# Patient Record
Sex: Female | Born: 2000 | Race: Black or African American | Hispanic: No | Marital: Single | Smoking: Never smoker
Health system: Southern US, Community
[De-identification: ages and names within clinical notes are randomized; demographics above are authoritative.]

## PROBLEM LIST (undated history)

## (undated) DIAGNOSIS — J45909 Unspecified asthma, uncomplicated: Secondary | ICD-10-CM

## (undated) DIAGNOSIS — E669 Obesity, unspecified: Secondary | ICD-10-CM

## (undated) DIAGNOSIS — IMO0002 Reserved for concepts with insufficient information to code with codable children: Secondary | ICD-10-CM

## (undated) DIAGNOSIS — E663 Overweight: Secondary | ICD-10-CM

## (undated) DIAGNOSIS — F419 Anxiety disorder, unspecified: Secondary | ICD-10-CM

## (undated) DIAGNOSIS — Z559 Problems related to education and literacy, unspecified: Secondary | ICD-10-CM

## (undated) DIAGNOSIS — Z0101 Encounter for examination of eyes and vision with abnormal findings: Secondary | ICD-10-CM

## (undated) DIAGNOSIS — J309 Allergic rhinitis, unspecified: Secondary | ICD-10-CM

## (undated) DIAGNOSIS — D573 Sickle-cell trait: Secondary | ICD-10-CM

## (undated) HISTORY — DX: Problems related to education and literacy, unspecified: Z55.9

## (undated) HISTORY — DX: Anxiety disorder, unspecified: F41.9

## (undated) NOTE — *Deleted (*Deleted)
Pt presents via POV with mother c/o swollen right index finger. Reports given abx for infections x4 days ago with worsening symptoms. Denies fevers.

---
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
## 2022-03-25 ENCOUNTER — Emergency Department (HOSPITAL_BASED_OUTPATIENT_CLINIC_OR_DEPARTMENT_OTHER)
Admission: EM | Admit: 2022-03-25 | Discharge: 2022-03-25 | Disposition: A | Discharge status: Home / Self Care | Payer: Medicaid Other | Attending: Emergency Medicine | Admitting: Emergency Medicine

## 2022-03-25 ENCOUNTER — Other Ambulatory Visit: Payer: Self-pay

## 2022-03-25 ENCOUNTER — Encounter (HOSPITAL_BASED_OUTPATIENT_CLINIC_OR_DEPARTMENT_OTHER): Payer: Self-pay

## 2022-03-25 ENCOUNTER — Emergency Department (HOSPITAL_BASED_OUTPATIENT_CLINIC_OR_DEPARTMENT_OTHER): Payer: Medicaid Other | Admitting: Radiology

## 2022-03-25 DIAGNOSIS — S80919A Unspecified superficial injury of unspecified knee, initial encounter: Secondary | ICD-10-CM | POA: Diagnosis not present

## 2022-03-25 DIAGNOSIS — Y92410 Unspecified street and highway as the place of occurrence of the external cause: Secondary | ICD-10-CM | POA: Diagnosis not present

## 2022-03-25 DIAGNOSIS — I10 Essential (primary) hypertension: Secondary | ICD-10-CM | POA: Diagnosis not present

## 2022-03-25 DIAGNOSIS — M25511 Pain in right shoulder: Secondary | ICD-10-CM | POA: Insufficient documentation

## 2022-03-25 DIAGNOSIS — G4489 Other headache syndrome: Secondary | ICD-10-CM | POA: Diagnosis not present

## 2022-03-25 DIAGNOSIS — S4991XA Unspecified injury of right shoulder and upper arm, initial encounter: Secondary | ICD-10-CM | POA: Diagnosis not present

## 2022-03-25 MED ORDER — ACETAMINOPHEN 500 MG PO TABS
1000.0000 mg | ORAL_TABLET | Freq: Once | ORAL | Status: AC
  Administered 2022-03-25: 1000 mg via ORAL
  Filled 2022-03-25: qty 2
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
## 2022-03-25 NOTE — ED Notes (Signed)
EMT-P provided AVS using Teachback Method. Patient verbalizes understanding of Discharge Instructions. Opportunity for Questioning and Answers were provided by EMT-P. Patient Discharged from ED.  ? ?

## 2022-03-25 NOTE — Discharge Instructions (Signed)
The x-ray of your shoulder is without any fracture or dislocation. ?Please take Tylenol 1000 mg every 6 hours as needed for pain drink plenty of water.  Gentle stretching, warm Epsom salt soaks and hydrating is very important. ? ?Your examination today is most concerning for a muscular injury ?1. Medications: alternate ibuprofen and tylenol for pain control, take all usual home medications as they are prescribed ?2. Treatment: rest, ice, elevate and use an ACE wrap or other compressive therapy to decrease swelling. Also drink plenty of fluids and do plenty of gentle stretching and move the affected muscle through its normal range of motion to prevent stiffness. ?3. Follow Up: If your symptoms do not improve please follow up with orthopedics/sports medicine or your PCP for discussion of your diagnoses and further evaluation after today's visit; if you do not have a primary care doctor use the resource guide provided to find one; Please return to the ER for worsening symptoms or other concerns.  ? ?
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
## 2022-03-26 ENCOUNTER — Telehealth: Payer: Self-pay

## 2022-03-26 NOTE — Telephone Encounter (Signed)
Transition Care Management Follow-up Telephone Call ?Date of discharge and from where: 03/25/2022 from [place] ?How have you been since you were released from the hospital? Patient stated that she is still in some pain and still has a headache. ED return precautions reviewed, including sob, cp, dizziness, changes in vision or n/v. Patient and patient mother stated understanding.  ?Any questions or concerns? No ? ?Items Reviewed: ?Did the pt receive and understand the discharge instructions provided? Yes  ?Medications obtained and verified? Yes  ?Other? No  ?Any new allergies since your discharge? No  ?Dietary orders reviewed? No ?Do you have support at home? Yes  ? ?Functional Questionnaire: (I = Independent and D = Dependent) ?ADLs: I ?Bathing/Dressing- I ?Meal Prep- I ?Eating- I ?Maintaining continence- I ?Transferring/Ambulation- I ?Managing Meds- I ? ?Follow up appointments reviewed: ? ?PCP Hospital f/u appt confirmed? Yes  Scheduled to see Eugenia Pancoast, FNP on 04/25/2022 @ 08:20am. ?[facility] f/u appt confirmed? No   ?Are transportation arrangements needed? No  ?If their condition worsens, is the pt aware to call PCP or go to the Emergency Dept.? Yes ?Was the patient provided with contact information for the PCP's office or ED? Yes ?Was to pt encouraged to call back with questions or concerns? Yes ? ?

## 2022-04-07 ENCOUNTER — Ambulatory Visit: Payer: Medicaid Other | Admitting: Family

## 2023-01-11 ENCOUNTER — Emergency Department
Admission: EM | Admit: 2023-01-11 | Discharge: 2023-01-11 | Disposition: A | Discharge status: Home / Self Care | Payer: Medicaid Other | Attending: Emergency Medicine | Admitting: Emergency Medicine

## 2023-01-11 ENCOUNTER — Encounter: Payer: Self-pay | Admitting: Emergency Medicine

## 2023-01-11 ENCOUNTER — Other Ambulatory Visit: Payer: Self-pay

## 2023-01-11 DIAGNOSIS — Z1152 Encounter for screening for COVID-19: Secondary | ICD-10-CM | POA: Diagnosis not present

## 2023-01-11 DIAGNOSIS — J45909 Unspecified asthma, uncomplicated: Secondary | ICD-10-CM | POA: Insufficient documentation

## 2023-01-11 DIAGNOSIS — K529 Noninfective gastroenteritis and colitis, unspecified: Secondary | ICD-10-CM | POA: Insufficient documentation

## 2023-01-11 DIAGNOSIS — R112 Nausea with vomiting, unspecified: Secondary | ICD-10-CM | POA: Diagnosis present

## 2023-01-11 LAB — COMPREHENSIVE METABOLIC PANEL
ALT: 14 U/L (ref 0–44)
AST: 22 U/L (ref 15–41)
Albumin: 4.2 g/dL (ref 3.5–5.0)
Alkaline Phosphatase: 41 U/L (ref 38–126)
Anion gap: 10 (ref 5–15)
BUN: 11 mg/dL (ref 6–20)
CO2: 22 mmol/L (ref 22–32)
Calcium: 9.1 mg/dL (ref 8.9–10.3)
Chloride: 108 mmol/L (ref 98–111)
Creatinine, Ser: 0.59 mg/dL (ref 0.44–1.00)
GFR, Estimated: >60 mL/min (ref >60)
Glucose, Bld: 106 mg/dL — ABNORMAL HIGH (ref 70–99)
Potassium: 3.4 mmol/L — ABNORMAL LOW (ref 3.5–5.1)
Sodium: 140 mmol/L (ref 135–145)
Total Bilirubin: 1.3 mg/dL — ABNORMAL HIGH (ref 0.3–1.2)
Total Protein: 7.7 g/dL (ref 6.5–8.1)

## 2023-01-11 LAB — CBC
HCT: 33.4 % — ABNORMAL LOW (ref 36.0–46.0)
Hemoglobin: 10.9 g/dL — ABNORMAL LOW (ref 12.0–15.0)
MCH: 25.1 pg — ABNORMAL LOW (ref 26.0–34.0)
MCHC: 32.6 g/dL (ref 30.0–36.0)
MCV: 77 fL — ABNORMAL LOW (ref 80.0–100.0)
Platelets: 284 10*3/uL (ref 150–400)
RBC: 4.34 MIL/uL (ref 3.87–5.11)
RDW: 15.4 % (ref 11.5–15.5)
WBC: 9 10*3/uL (ref 4.0–10.5)
nRBC: 0 % (ref 0.0–0.2)

## 2023-01-11 LAB — RESP PANEL BY RT-PCR (RSV, FLU A&B, COVID)  RVPGX2
Influenza A by PCR: NEGATIVE
Influenza B by PCR: NEGATIVE
Resp Syncytial Virus by PCR: NEGATIVE
SARS Coronavirus 2 by RT PCR: NEGATIVE

## 2023-01-11 LAB — LIPASE, BLOOD: Lipase: 28 U/L (ref 11–51)

## 2023-01-11 MED ORDER — ONDANSETRON 4 MG PO TBDP
4.0000 mg | ORAL_TABLET | Freq: Once | ORAL | Status: AC
  Administered 2023-01-11: 4 mg via ORAL
  Filled 2023-01-11: qty 1

## 2023-01-11 MED ORDER — ONDANSETRON 4 MG PO TBDP: 4.0000 mg | ORAL_TABLET | Freq: Three times a day (TID) | ORAL | 0 refills | Status: AC | PRN

## 2023-01-11 NOTE — ED Triage Notes (Signed)
Patient to ED for vomiting and diarrhea that started last PM. Also, low energy.

## 2023-01-11 NOTE — ED Provider Notes (Signed)
   [facility] Provider Note    Event Date/Time   First MD Initiated Contact with Patient 01/11/23 1040     (approximate)  History   Chief Complaint: Emesis  HPI  Nicole Bowen is a 22 y.o. female with a past medical history of anxiety, asthma, presents to the emergency department for nausea vomiting diarrhea.  Patient states last night around midnight she developed nausea vomiting diarrhea, here with her mother who has the same symptoms that started at a similar time.  Patient denies any fever, although borderline low-grade 99.9 in the emergency department.  Denies any abdominal pain states cramping if she has to have a bowel movement or vomit.  Patient states she last vomited approximately 3 hours ago.  Physical Exam   Triage Vital Signs: ED Triage Vitals  Enc Vitals Group     BP 01/11/23 0943 121/72     Pulse Rate 01/11/23 0943 99     Resp 01/11/23 0943 16     Temp 01/11/23 0943 99.9 F (37.7 C)     Temp Source 01/11/23 0943 Oral     SpO2 01/11/23 0943 100 %     Weight --      Height --      Head Circumference --      Peak Flow --      Pain Score 01/11/23 0940 2     Pain Loc --      Pain Edu? --      Excl. in [place]? --     Most recent vital signs: Vitals:   01/11/23 0943  BP: 121/72  Pulse: 99  Resp: 16  Temp: 99.9 F (37.7 C)  SpO2: 100%    General: Awake, no distress.  CV:  Good peripheral perfusion.  Regular rate and rhythm  Resp:  Normal effort.  Equal breath sounds bilaterally.  Abd:  No distention.  Soft, nontender.  No rebound or guarding.   ED Results / Procedures / Treatments   MEDICATIONS ORDERED IN ED: Medications - No data to display   IMPRESSION / MDM / [place] / ED COURSE  I reviewed the triage vital signs and the nursing notes.  Patient's presentation is most consistent with acute presentation with potential threat to life or bodily function.  Patient presents emergency department for nausea vomiting  diarrhea starting last night continuing over the night.  Mom is here with the same symptoms starting similar time.  Overall the patient appears well, benign abdomen, reassuring vitals besides borderline low-grade temperature.  Patient's COVID/flu/RSV test is negative lipase is normal chemistry is normal and CBC is normal.  Highly suspect gastroenteritis whether this be a viral infection or foodborne illness.  Discussed with the patient supportive care at home including plenty of fluids and rest we will treat with Zofran to be used as needed for nausea.  Discussed with the patient loperamide use after 24 hours of diarrhea if needed.  Discussed my typical abdominal pain return precautions.  Patient agreeable to plan of care.  FINAL CLINICAL IMPRESSION(S) / ED DIAGNOSES   Gastroenteritis  Rx / DC Orders   Zofran ODT  Note:  This document was prepared using Dragon voice recognition software and may include unintentional dictation errors.   Harvest Dark, MD 01/11/23 1110

## 2023-02-17 DIAGNOSIS — H5213 Myopia, bilateral: Secondary | ICD-10-CM | POA: Diagnosis not present

## 2023-04-07 ENCOUNTER — Ambulatory Visit
Admission: EM | Admit: 2023-04-07 | Discharge: 2023-04-07 | Disposition: A | Discharge status: Home / Self Care | Payer: Medicaid Other | Attending: Urgent Care | Admitting: Urgent Care

## 2023-04-07 DIAGNOSIS — J069 Acute upper respiratory infection, unspecified: Secondary | ICD-10-CM | POA: Diagnosis not present

## 2023-04-07 DIAGNOSIS — J45909 Unspecified asthma, uncomplicated: Secondary | ICD-10-CM | POA: Diagnosis not present

## 2023-04-07 LAB — POCT RAPID STREP A (OFFICE): Rapid Strep A Screen: NEGATIVE

## 2023-04-07 MED ORDER — VENTOLIN HFA 108 (90 BASE) MCG/ACT IN AERS: 1.0000 | INHALATION_SPRAY | Freq: Four times a day (QID) | RESPIRATORY_TRACT | 0 refills | Status: AC | PRN

## 2023-04-07 NOTE — ED Triage Notes (Signed)
Sore throat, congestion, cough, SOB that started Sunday. Taking mucinx and tylenol. Doesn't have inhaler would like a refill if possible.

## 2023-04-07 NOTE — Discharge Instructions (Addendum)
You may try the following over-the-counter medications to help with your symptoms:  - Nasal decongestant, pseudoephedrine - Nasal congestion, fluticasone nasal spray - Runny nose, azelastine nasal spray

## 2023-04-07 NOTE — ED Provider Notes (Signed)
Nicole Bowen    CSN: [id number] Arrival date & time: 04/07/23  0813      History   Chief Complaint Chief Complaint  Patient presents with   Sore Throat   Nasal Congestion    HPI Nicole Bowen is a 22 y.o. female.    Sore Throat    Patient presents to urgent care with complaint of sore throat, congestion, cough, "shortness of breath", starting 3 days ago.  Has been using Mucinex Sinus and Tylenol.  She endorses history of asthma and does not have inhaler.  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 No pertinent surgical history.  OB History   No obstetric history on file.      Home Medications    Prior to Admission medications   Medication Sig Start Date End Date Taking? Authorizing Provider  cetirizine (ZYRTEC) 10 MG tablet Take 1 tablet (10 mg total) by mouth at bedtime. 12/30/18   Theadore Nan, MD  Clindamycin-Benzoyl Per, Refr, gel Thin topical layer 1-2 times a day 07/14/21   Theadore Nan, MD  fexofenadine-pseudoephedrine (ALLEGRA-D) 60-120 MG 12 hr tablet Take 1 tablet by mouth 2 (two) times daily. 05/07/21   Joni Reining, PA-C  fluticasone (FLONASE) 50 MCG/ACT nasal spray SHAKE LIQUID AND USE 2 SPRAYS IN [facility] NOSTRIL DAILY 02/20/19   Theadore Nan, MD  ondansetron (ZOFRAN-ODT) 4 MG disintegrating tablet Take 1 tablet (4 mg total) by mouth every 8 (eight) hours as needed for nausea or vomiting. 01/11/23   Minna Antis, MD    Family History Family History  Problem Relation Age of Onset   Hypertension Mother    Allergic rhinitis Mother    Depression Mother    Clotting disorder Mother        clotting after surgery   ADD / ADHD Brother    ODD Brother    Early death Paternal Grandmother         died at 101, cardiac   Depression Maternal Grandmother    Early death Maternal Grandmother        CV/ HTN related   Asthma Brother     Social History Social History   Tobacco Use   Smoking status: Never   Smokeless tobacco: Never   Tobacco comments:    mom smokes outside  Substance Use Topics   Alcohol use: No   Drug use: No     Allergies  Dust mite extract, Penicillins, Pollen extract, and Bee pollen   Review of Systems Review of Systems   Physical Exam Triage Vital Signs ED Triage Vitals [04/07/23 0834]  Enc Vitals Group     BP 121/78     Pulse Rate 84     Resp 16     Temp 99 F (37.2 C)     Temp Source Oral     SpO2 98 %     Weight      Height      Head Circumference      Peak Flow      Pain Score 2     Pain Loc      Pain Edu?      Excl. in [place]?    No data found.  Updated Vital Signs BP 121/78 (BP Location: Right Arm)   Pulse 84   Temp 99 F (37.2 C) (Oral)   Resp 16   LMP 03/26/2023 (Approximate)   SpO2 98%   Visual Acuity Right Eye Distance:   Left Eye Distance:   Bilateral Distance:    Right Eye Near:   Left Eye Near:    Bilateral Near:     Physical Exam Vitals reviewed.  Constitutional:      Appearance:  She is well-developed. She is not ill-appearing.  HENT:     Right Ear: Tympanic membrane normal.     Left Ear: Tympanic membrane normal.     Mouth/Throat:     Mouth: Mucous membranes are moist.     Pharynx: Posterior oropharyngeal erythema present. No oropharyngeal exudate.     Tonsils: No tonsillar exudate.  Cardiovascular:     Rate and Rhythm: Normal rate and regular rhythm.     Heart sounds: Normal heart sounds.  Pulmonary:     Effort: Pulmonary effort is normal.     Breath sounds: Normal breath sounds.  Musculoskeletal:     Cervical back: Normal range of motion and neck supple.  Skin:    General: Skin is warm and dry.  Neurological:     General: No focal deficit present.     Mental Status: She is alert and oriented to person, place, and time.  Psychiatric:        Mood and Affect: Mood normal.        Behavior: Behavior normal.      UC Treatments / Results  Labs (all labs ordered are listed, but only abnormal results are displayed) Labs Reviewed  POCT RAPID STREP A (OFFICE)    EKG   Radiology No results found.  Procedures Procedures (including critical care time)  Medications Ordered in UC Medications - No data to display  Initial Impression / Assessment and Plan / UC Course  I have reviewed the triage vital signs and the nursing notes.  Pertinent labs & imaging results that were available during my care of the patient were reviewed by me and considered in my medical decision making (see chart for details).   Nicole Bowen is a 22 y.o. female presenting with URI symptoms. Patient is afebrile without recent antipyretics, satting well on room air. Overall is well appearing though non-toxic, well hydrated, without respiratory distress. Pulmonary exam is unremarkable.  Lungs CTAB without wheezing, rhonchi, rales.  TMs are WNL bilaterally.  Mild pharyngeal erythema.  No peritonsillar exudates.  Rapid strep is negative.  Patient's symptoms are consistent with an  acute viral process and recommending supportive care and use of OTC medication for symptom control.  Will prescribe  albuterol inhaler to control her asthma symptoms.  Counseled patient on potential for adverse effects with medications prescribed/recommended today, ER and return-to-clinic precautions discussed, patient verbalized understanding and agreement with care plan.   Final Clinical Impressions(s) / UC Diagnoses   Final diagnoses:  None   Discharge Instructions   None    ED Prescriptions   None    PDMP not reviewed this encounter.   Charma Igo, [place] 04/07/23 [phone]

## 2023-04-13 ENCOUNTER — Other Ambulatory Visit (HOSPITAL_BASED_OUTPATIENT_CLINIC_OR_DEPARTMENT_OTHER): Payer: Self-pay

## 2023-04-13 ENCOUNTER — Other Ambulatory Visit: Payer: Self-pay

## 2023-04-13 ENCOUNTER — Emergency Department (HOSPITAL_BASED_OUTPATIENT_CLINIC_OR_DEPARTMENT_OTHER): Payer: Medicaid Other | Admitting: Radiology

## 2023-04-13 ENCOUNTER — Encounter (HOSPITAL_BASED_OUTPATIENT_CLINIC_OR_DEPARTMENT_OTHER): Payer: Self-pay

## 2023-04-13 ENCOUNTER — Emergency Department (HOSPITAL_BASED_OUTPATIENT_CLINIC_OR_DEPARTMENT_OTHER)
Admission: EM | Admit: 2023-04-13 | Discharge: 2023-04-13 | Disposition: A | Discharge status: Home / Self Care | Payer: Medicaid Other | Attending: Emergency Medicine | Admitting: Emergency Medicine

## 2023-04-13 DIAGNOSIS — Z1152 Encounter for screening for COVID-19: Secondary | ICD-10-CM | POA: Diagnosis not present

## 2023-04-13 DIAGNOSIS — J069 Acute upper respiratory infection, unspecified: Secondary | ICD-10-CM | POA: Diagnosis not present

## 2023-04-13 DIAGNOSIS — J45909 Unspecified asthma, uncomplicated: Secondary | ICD-10-CM | POA: Diagnosis not present

## 2023-04-13 DIAGNOSIS — B9789 Other viral agents as the cause of diseases classified elsewhere: Secondary | ICD-10-CM | POA: Diagnosis not present

## 2023-04-13 DIAGNOSIS — Z7951 Long term (current) use of inhaled steroids: Secondary | ICD-10-CM | POA: Insufficient documentation

## 2023-04-13 DIAGNOSIS — R059 Cough, unspecified: Secondary | ICD-10-CM | POA: Diagnosis present

## 2023-04-13 LAB — RESP PANEL BY RT-PCR (RSV, FLU A&B, COVID)  RVPGX2
Influenza A by PCR: NEGATIVE
Influenza B by PCR: NEGATIVE
Resp Syncytial Virus by PCR: NEGATIVE
SARS Coronavirus 2 by RT PCR: NEGATIVE

## 2023-04-13 MED ORDER — AZITHROMYCIN 250 MG PO TABS: 250.0000 mg | ORAL_TABLET | Freq: Every day | ORAL | 0 refills | Status: AC

## 2023-04-13 MED ORDER — QVAR REDIHALER 40 MCG/ACT IN AERB: 2.0000 | INHALATION_SPRAY | Freq: Two times a day (BID) | RESPIRATORY_TRACT | 0 refills | Status: AC

## 2023-04-13 MED ORDER — DEXAMETHASONE 4 MG PO TABS
10.0000 mg | ORAL_TABLET | Freq: Once | ORAL | Status: AC
  Administered 2023-04-13: 10 mg via ORAL
  Filled 2023-04-13: qty 3

## 2023-04-13 NOTE — Discharge Instructions (Signed)
Take tylenol 2 pills 4 times a day and motrin 4 pills 3 times a day.  Drink plenty of fluids.  Return for worsening shortness of breath, headache, confusion. Follow up with your family doctor.   

## 2023-04-13 NOTE — ED Triage Notes (Signed)
Patient here POV from Home.  Endorses Sore Throat, Congestion, Cough for 1-2 Weeks. Seen at [facility] and tested Negative for Strep. Has been using Inhaler with some relief. Seeks Evaluation for worsened symptoms.   NAD Noted during Triage. A&Ox4. GCS 15. Ambulatory.

## 2023-04-13 NOTE — ED Notes (Signed)
Discharge paperwork given and verbally understood. 

## 2023-04-13 NOTE — ED Provider Notes (Signed)
[place] EMERGENCY DEPARTMENT AT [facility] Provider Note   CSN: [id number] Arrival date & time: 04/13/23  1201     History  Chief Complaint  Patient presents with   Cough    Nicole Bowen is a 22 y.o. female.  22 yo F with a chief complaints of cough.  This been going on for about 3 weeks.  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  They have been sick off and on with something similar.  Eating and drinking but less than normal.  Remote history of asthma has not required any medications for about 12 years.  Has been taking what is left over of her asthma medications for the past couple days with some improvement.   Cough      Home Medications Prior to Admission medications   Medication Sig Start Date End Date Taking? Authorizing Provider  azithromycin (ZITHROMAX) 250 MG tablet Take 1 tablet (250 mg total) by mouth daily. Take first 2 tablets together, then 1 every day until finished. 04/13/23  Yes Melene Plan, DO  beclomethasone (QVAR REDIHALER) 40 MCG/ACT inhaler Inhale 2 puffs into the lungs 2 (two) times daily. 04/13/23  Yes Melene Plan, DO  cetirizine (ZYRTEC) 10 MG tablet Take 1 tablet (10 mg total) by mouth at bedtime. 12/30/18   Theadore Nan, MD  Clindamycin-Benzoyl Per, Refr, gel Thin topical layer 1-2 times a day 07/14/21   Theadore Nan, MD  fexofenadine-pseudoephedrine (ALLEGRA-D) 60-120 MG 12 hr tablet Take 1 tablet by mouth 2 (two) times daily. 05/07/21   Joni Reining, PA-C  fluticasone (FLONASE) 50 MCG/ACT nasal spray SHAKE LIQUID AND USE 2 SPRAYS IN [facility] NOSTRIL DAILY 02/20/19   Theadore Nan, MD  ondansetron (ZOFRAN-ODT) 4 MG disintegrating tablet Take 1 tablet (4 mg total) by mouth every 8 (eight) hours as needed for nausea or vomiting. 01/11/23   Minna Antis, MD  VENTOLIN HFA 108 (90 Base) MCG/ACT inhaler Inhale 1-2 puffs into the lungs every 6 (six) hours as needed for wheezing or shortness of breath. 04/07/23   Immordino, Jeannett Senior, FNP       Allergies    Dust mite extract, Penicillins, Pollen extract, and Bee pollen    Review of Systems   Review of Systems  Respiratory:  Positive for cough.     Physical Exam Updated Vital Signs BP 115/73 (BP Location: Right Arm)   Pulse 91   Temp 98 F (36.7 C) (Oral)   Resp 18   Ht 5\' 9"  (1.753 m)   Wt 72.6 kg   LMP 03/26/2023 (Approximate)   SpO2 100%   BMI 23.63 kg/m  Physical Exam Vitals and nursing note reviewed.  Constitutional:      General: She is not in acute distress.    Appearance: She is well-developed. She is not diaphoretic.  HENT:     Head: Normocephalic and atraumatic.     Comments: Swollen turbinates, posterior nasal drip, no noted sinus ttp, tm normal bilaterally.   Eyes:     Pupils: Pupils are equal, round, and reactive to light.  Cardiovascular:     Rate and Rhythm: Normal rate and regular rhythm.     Heart sounds: No murmur heard.    No friction rub. No gallop.  Pulmonary:     Effort: Pulmonary effort is normal.     Breath sounds: No wheezing or rales.  Abdominal:     General: There is no distension.     Palpations: Abdomen is soft.  Tenderness: There is no abdominal tenderness.  Musculoskeletal:        General: No tenderness.     Cervical back: Normal range of motion and neck supple.  Skin:    General: Skin is warm and dry.  Neurological:     Mental Status: She is alert and oriented to person, place, and time.  Psychiatric:        Behavior: Behavior normal.     ED Results / Procedures / Treatments   Labs (all labs ordered are listed, but only abnormal results are displayed) Labs Reviewed  RESP PANEL BY RT-PCR (RSV, FLU A&B, COVID)  [id number]    EKG None  Radiology DG Chest 2 View  Result Date: 04/13/2023 CLINICAL DATA:  Cough and congestion.  Shortness of breath EXAM: CHEST - 2 VIEW COMPARISON:  X-ray 05/07/2021 FINDINGS: The heart size and mediastinal contours are within normal limits. Both lungs are clear. No  consolidation, pneumothorax, effusion or edema. The visualized skeletal structures are unremarkable. Curvature of the spine. Mild eventration of the right hemidiaphragm. IMPRESSION: No acute cardiopulmonary disease Electronically Signed   By: Karen Kays M.D.   On: 04/13/2023 13:19    Procedures Procedures    Medications Ordered in ED Medications  dexamethasone (DECADRON) tablet 10 mg (10 mg Oral Given 04/13/23 1355)    ED Course/ Medical Decision Making/ A&P                             Medical Decision Making Amount and/or Complexity of Data Reviewed Radiology: ordered.  Risk Prescription drug management.   22 yo F with a chief complaints of cough and congestion.  Going on for 3 weeks now.  She is well-appearing nontoxic.  Clear lung sounds for me.  With reported improvement with beta agonist therapy will give a dose of steroids here.  Patient is requesting her steroid inhaler that she had many years ago.  PCP follow-up.  Chest x-ray independently interpreted by me without focal infiltrate or pneumothorax.  2:16 PM:  I have discussed the diagnosis/risks/treatment options with the patient and family.  Evaluation and diagnostic testing in the emergency department does not suggest an emergent condition requiring admission or immediate intervention beyond what has been performed at this time.  They will follow up with PCP. We also discussed returning to the ED immediately if new or worsening sx occur. We discussed the sx which are most concerning (e.g., sudden worsening pain, fever, inability to tolerate by mouth) that necessitate immediate return. Medications administered to the patient during their visit and any new prescriptions provided to the patient are listed below.  Medications given during this visit Medications  dexamethasone (DECADRON) tablet 10 mg (10 mg Oral Given 04/13/23 1355)     The patient appears reasonably screen and/or stabilized for discharge and I doubt any other  medical condition or other [facility] requiring further screening, evaluation, or treatment in the ED at this time prior to discharge.          Final Clinical Impression(s) / ED Diagnoses Final diagnoses:  Viral URI with cough    Rx / DC Orders ED Discharge Orders          Ordered    beclomethasone (QVAR REDIHALER) 40 MCG/ACT inhaler  2 times daily        04/13/23 1333    azithromycin (ZITHROMAX) 250 MG tablet  Daily        04/13/23 1335  Melene Plan, DO 04/13/23 1416

## 2023-12-31 ENCOUNTER — Encounter (HOSPITAL_BASED_OUTPATIENT_CLINIC_OR_DEPARTMENT_OTHER): Payer: Self-pay | Admitting: Emergency Medicine

## 2023-12-31 ENCOUNTER — Other Ambulatory Visit: Payer: Self-pay

## 2023-12-31 ENCOUNTER — Emergency Department (HOSPITAL_BASED_OUTPATIENT_CLINIC_OR_DEPARTMENT_OTHER)
Admission: EM | Admit: 2023-12-31 | Discharge: 2023-12-31 | Discharge status: Against Medical Advice | Payer: No Typology Code available for payment source | Attending: Emergency Medicine | Admitting: Emergency Medicine

## 2023-12-31 DIAGNOSIS — M542 Cervicalgia: Secondary | ICD-10-CM | POA: Insufficient documentation

## 2023-12-31 DIAGNOSIS — Y92410 Unspecified street and highway as the place of occurrence of the external cause: Secondary | ICD-10-CM | POA: Insufficient documentation

## 2023-12-31 DIAGNOSIS — R519 Headache, unspecified: Secondary | ICD-10-CM | POA: Insufficient documentation

## 2023-12-31 DIAGNOSIS — Z5321 Procedure and treatment not carried out due to patient leaving prior to being seen by health care provider: Secondary | ICD-10-CM | POA: Diagnosis not present
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
## 2024-01-05 ENCOUNTER — Other Ambulatory Visit: Payer: Self-pay

## 2024-01-05 ENCOUNTER — Emergency Department
Admission: EM | Admit: 2024-01-05 | Discharge: 2024-01-05 | Discharge status: Against Medical Advice | Payer: Medicaid Other | Attending: Emergency Medicine | Admitting: Emergency Medicine

## 2024-01-05 DIAGNOSIS — Z5321 Procedure and treatment not carried out due to patient leaving prior to being seen by health care provider: Secondary | ICD-10-CM | POA: Diagnosis not present

## 2024-01-05 DIAGNOSIS — R1010 Upper abdominal pain, unspecified: Secondary | ICD-10-CM | POA: Insufficient documentation

## 2024-01-05 DIAGNOSIS — M542 Cervicalgia: Secondary | ICD-10-CM | POA: Insufficient documentation

## 2024-01-05 DIAGNOSIS — M549 Dorsalgia, unspecified: Secondary | ICD-10-CM | POA: Diagnosis not present
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
## 2024-01-07 ENCOUNTER — Emergency Department
Admission: EM | Admit: 2024-01-07 | Discharge: 2024-01-08 | Disposition: A | Discharge status: Home / Self Care | Payer: No Typology Code available for payment source | Source: Home / Self Care

## 2024-01-13 ENCOUNTER — Encounter: Payer: Self-pay | Admitting: Family Medicine

## 2024-01-13 ENCOUNTER — Ambulatory Visit (INDEPENDENT_AMBULATORY_CARE_PROVIDER_SITE_OTHER): Payer: Self-pay | Admitting: Family Medicine

## 2024-01-13 ENCOUNTER — Other Ambulatory Visit: Payer: Self-pay

## 2024-01-13 VITALS — BP 124/82 | Ht 70.0 in | Wt 180.0 lb

## 2024-01-13 DIAGNOSIS — M79661 Pain in right lower leg: Secondary | ICD-10-CM

## 2024-01-13 DIAGNOSIS — S8011XA Contusion of right lower leg, initial encounter: Secondary | ICD-10-CM
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
## 2024-02-03 ENCOUNTER — Ambulatory Visit: Payer: Medicaid Other | Admitting: Family Medicine

## 2024-02-25 ENCOUNTER — Encounter: Payer: Medicaid Other | Admitting: Radiology

## 2024-02-28 DIAGNOSIS — N939 Abnormal uterine and vaginal bleeding, unspecified: Secondary | ICD-10-CM | POA: Diagnosis not present

## 2024-04-27 ENCOUNTER — Encounter: Admitting: Certified Nurse Midwife

## 2024-05-04 DIAGNOSIS — J45909 Unspecified asthma, uncomplicated: Secondary | ICD-10-CM | POA: Diagnosis not present

## 2024-05-04 DIAGNOSIS — J029 Acute pharyngitis, unspecified: Secondary | ICD-10-CM | POA: Diagnosis not present

## 2024-05-04 DIAGNOSIS — R110 Nausea: Secondary | ICD-10-CM | POA: Diagnosis not present

## 2024-05-04 DIAGNOSIS — J042 Acute laryngotracheitis: Secondary | ICD-10-CM | POA: Diagnosis not present
